# Patient Record
Sex: Female | Born: 1970 | Race: White | Hispanic: No | Marital: Married
Health system: Southern US, Community
[De-identification: ages and names within clinical notes are randomized; demographics above are authoritative.]

---

## 2016-10-24 ENCOUNTER — Ambulatory Visit: Payer: Self-pay | Admitting: Osteopathic Medicine

## 2016-11-16 DIAGNOSIS — Z1231 Encounter for screening mammogram for malignant neoplasm of breast: Secondary | ICD-10-CM | POA: Diagnosis not present

## 2016-11-20 DIAGNOSIS — S61411D Laceration without foreign body of right hand, subsequent encounter: Secondary | ICD-10-CM | POA: Diagnosis not present

## 2016-11-20 DIAGNOSIS — Z4802 Encounter for removal of sutures: Secondary | ICD-10-CM | POA: Diagnosis not present

## 2016-12-06 DIAGNOSIS — R110 Nausea: Secondary | ICD-10-CM | POA: Diagnosis not present

## 2016-12-06 DIAGNOSIS — M5136 Other intervertebral disc degeneration, lumbar region: Secondary | ICD-10-CM | POA: Diagnosis not present

## 2016-12-06 DIAGNOSIS — G8929 Other chronic pain: Secondary | ICD-10-CM | POA: Diagnosis not present

## 2016-12-06 DIAGNOSIS — M5442 Lumbago with sciatica, left side: Secondary | ICD-10-CM | POA: Diagnosis not present

## 2016-12-06 DIAGNOSIS — K824 Cholesterolosis of gallbladder: Secondary | ICD-10-CM | POA: Diagnosis not present

## 2016-12-06 DIAGNOSIS — M5441 Lumbago with sciatica, right side: Secondary | ICD-10-CM | POA: Diagnosis not present

## 2016-12-06 DIAGNOSIS — M542 Cervicalgia: Secondary | ICD-10-CM | POA: Diagnosis not present

## 2017-01-16 DIAGNOSIS — Z79899 Other long term (current) drug therapy: Secondary | ICD-10-CM | POA: Diagnosis not present

## 2017-01-16 DIAGNOSIS — R202 Paresthesia of skin: Secondary | ICD-10-CM | POA: Diagnosis not present

## 2017-01-16 DIAGNOSIS — M5417 Radiculopathy, lumbosacral region: Secondary | ICD-10-CM | POA: Diagnosis not present

## 2017-01-16 DIAGNOSIS — G43019 Migraine without aura, intractable, without status migrainosus: Secondary | ICD-10-CM | POA: Diagnosis not present

## 2017-01-23 DIAGNOSIS — L600 Ingrowing nail: Secondary | ICD-10-CM | POA: Diagnosis not present

## 2017-01-23 DIAGNOSIS — M898X7 Other specified disorders of bone, ankle and foot: Secondary | ICD-10-CM | POA: Diagnosis not present

## 2017-01-23 DIAGNOSIS — L03032 Cellulitis of left toe: Secondary | ICD-10-CM | POA: Diagnosis not present

## 2017-02-27 DIAGNOSIS — L600 Ingrowing nail: Secondary | ICD-10-CM | POA: Diagnosis not present

## 2017-03-07 DIAGNOSIS — M545 Low back pain: Secondary | ICD-10-CM | POA: Diagnosis not present

## 2017-03-07 DIAGNOSIS — R350 Frequency of micturition: Secondary | ICD-10-CM | POA: Diagnosis not present

## 2017-04-24 DIAGNOSIS — G43019 Migraine without aura, intractable, without status migrainosus: Secondary | ICD-10-CM | POA: Diagnosis not present

## 2017-04-24 DIAGNOSIS — M5417 Radiculopathy, lumbosacral region: Secondary | ICD-10-CM | POA: Diagnosis not present

## 2017-04-24 DIAGNOSIS — Z79899 Other long term (current) drug therapy: Secondary | ICD-10-CM | POA: Diagnosis not present

## 2017-04-24 DIAGNOSIS — M542 Cervicalgia: Secondary | ICD-10-CM | POA: Diagnosis not present

## 2017-04-24 DIAGNOSIS — M5412 Radiculopathy, cervical region: Secondary | ICD-10-CM | POA: Diagnosis not present

## 2017-07-31 DIAGNOSIS — G43019 Migraine without aura, intractable, without status migrainosus: Secondary | ICD-10-CM | POA: Diagnosis not present

## 2017-07-31 DIAGNOSIS — M5417 Radiculopathy, lumbosacral region: Secondary | ICD-10-CM | POA: Diagnosis not present

## 2017-07-31 DIAGNOSIS — Z79899 Other long term (current) drug therapy: Secondary | ICD-10-CM | POA: Diagnosis not present

## 2017-07-31 DIAGNOSIS — R270 Ataxia, unspecified: Secondary | ICD-10-CM | POA: Diagnosis not present

## 2017-07-31 DIAGNOSIS — G5603 Carpal tunnel syndrome, bilateral upper limbs: Secondary | ICD-10-CM | POA: Diagnosis not present

## 2017-07-31 DIAGNOSIS — M542 Cervicalgia: Secondary | ICD-10-CM | POA: Diagnosis not present

## 2017-09-11 DIAGNOSIS — G4719 Other hypersomnia: Secondary | ICD-10-CM | POA: Diagnosis not present

## 2017-09-11 DIAGNOSIS — R270 Ataxia, unspecified: Secondary | ICD-10-CM | POA: Diagnosis not present

## 2017-09-11 DIAGNOSIS — G5603 Carpal tunnel syndrome, bilateral upper limbs: Secondary | ICD-10-CM | POA: Diagnosis not present

## 2017-09-11 DIAGNOSIS — M5417 Radiculopathy, lumbosacral region: Secondary | ICD-10-CM | POA: Diagnosis not present

## 2017-09-11 DIAGNOSIS — G43019 Migraine without aura, intractable, without status migrainosus: Secondary | ICD-10-CM | POA: Diagnosis not present

## 2017-09-11 DIAGNOSIS — M5412 Radiculopathy, cervical region: Secondary | ICD-10-CM | POA: Diagnosis not present

## 2017-09-11 DIAGNOSIS — R202 Paresthesia of skin: Secondary | ICD-10-CM | POA: Diagnosis not present

## 2017-11-06 DIAGNOSIS — R260 Ataxic gait: Secondary | ICD-10-CM | POA: Diagnosis not present

## 2017-11-06 DIAGNOSIS — M5417 Radiculopathy, lumbosacral region: Secondary | ICD-10-CM | POA: Diagnosis not present

## 2017-11-06 DIAGNOSIS — G43019 Migraine without aura, intractable, without status migrainosus: Secondary | ICD-10-CM | POA: Diagnosis not present

## 2017-11-25 DIAGNOSIS — G5603 Carpal tunnel syndrome, bilateral upper limbs: Secondary | ICD-10-CM | POA: Diagnosis not present

## 2017-11-25 DIAGNOSIS — R5383 Other fatigue: Secondary | ICD-10-CM | POA: Diagnosis not present

## 2017-11-25 DIAGNOSIS — Z1231 Encounter for screening mammogram for malignant neoplasm of breast: Secondary | ICD-10-CM | POA: Diagnosis not present

## 2017-11-25 DIAGNOSIS — Z0000 Encounter for general adult medical examination without abnormal findings: Secondary | ICD-10-CM | POA: Diagnosis not present

## 2017-11-25 DIAGNOSIS — K5900 Constipation, unspecified: Secondary | ICD-10-CM | POA: Diagnosis not present

## 2017-11-25 DIAGNOSIS — R002 Palpitations: Secondary | ICD-10-CM | POA: Diagnosis not present

## 2017-11-26 DIAGNOSIS — Z0000 Encounter for general adult medical examination without abnormal findings: Secondary | ICD-10-CM | POA: Diagnosis not present

## 2017-11-26 DIAGNOSIS — R002 Palpitations: Secondary | ICD-10-CM | POA: Diagnosis not present

## 2018-02-12 DIAGNOSIS — J209 Acute bronchitis, unspecified: Secondary | ICD-10-CM | POA: Diagnosis not present

## 2018-02-12 DIAGNOSIS — N390 Urinary tract infection, site not specified: Secondary | ICD-10-CM | POA: Diagnosis not present

## 2018-03-26 DIAGNOSIS — R05 Cough: Secondary | ICD-10-CM | POA: Diagnosis not present

## 2018-03-26 DIAGNOSIS — R062 Wheezing: Secondary | ICD-10-CM | POA: Diagnosis not present

## 2018-05-28 DIAGNOSIS — Z79899 Other long term (current) drug therapy: Secondary | ICD-10-CM | POA: Diagnosis not present

## 2018-05-28 DIAGNOSIS — G43109 Migraine with aura, not intractable, without status migrainosus: Secondary | ICD-10-CM | POA: Diagnosis not present

## 2018-05-28 DIAGNOSIS — M5417 Radiculopathy, lumbosacral region: Secondary | ICD-10-CM | POA: Diagnosis not present

## 2018-07-29 DIAGNOSIS — J019 Acute sinusitis, unspecified: Secondary | ICD-10-CM | POA: Diagnosis not present

## 2018-07-29 DIAGNOSIS — Z03818 Encounter for observation for suspected exposure to other biological agents ruled out: Secondary | ICD-10-CM | POA: Diagnosis not present

## 2018-08-23 DIAGNOSIS — M545 Low back pain: Secondary | ICD-10-CM | POA: Diagnosis not present

## 2018-08-23 DIAGNOSIS — R3129 Other microscopic hematuria: Secondary | ICD-10-CM | POA: Diagnosis not present

## 2018-08-24 ENCOUNTER — Other Ambulatory Visit: Payer: Self-pay

## 2018-08-24 ENCOUNTER — Other Ambulatory Visit: Payer: Self-pay | Admitting: Unknown Physician Specialty

## 2018-08-24 ENCOUNTER — Ambulatory Visit (INDEPENDENT_AMBULATORY_CARE_PROVIDER_SITE_OTHER): Payer: BC Managed Care – PPO

## 2018-08-24 DIAGNOSIS — R0602 Shortness of breath: Secondary | ICD-10-CM | POA: Diagnosis not present

## 2018-08-24 DIAGNOSIS — R319 Hematuria, unspecified: Secondary | ICD-10-CM

## 2018-08-24 DIAGNOSIS — K5900 Constipation, unspecified: Secondary | ICD-10-CM | POA: Diagnosis not present

## 2018-08-24 DIAGNOSIS — M549 Dorsalgia, unspecified: Secondary | ICD-10-CM | POA: Diagnosis not present

## 2018-08-24 DIAGNOSIS — R109 Unspecified abdominal pain: Secondary | ICD-10-CM | POA: Diagnosis not present

## 2018-08-25 ENCOUNTER — Other Ambulatory Visit: Payer: Self-pay | Admitting: Unknown Physician Specialty

## 2018-08-25 DIAGNOSIS — R319 Hematuria, unspecified: Secondary | ICD-10-CM

## 2018-08-25 DIAGNOSIS — R109 Unspecified abdominal pain: Secondary | ICD-10-CM

## 2018-08-26 ENCOUNTER — Other Ambulatory Visit: Payer: Self-pay

## 2018-08-26 ENCOUNTER — Ambulatory Visit (INDEPENDENT_AMBULATORY_CARE_PROVIDER_SITE_OTHER): Payer: BC Managed Care – PPO

## 2018-08-26 DIAGNOSIS — R109 Unspecified abdominal pain: Secondary | ICD-10-CM | POA: Diagnosis not present

## 2018-08-26 DIAGNOSIS — R319 Hematuria, unspecified: Secondary | ICD-10-CM

## 2018-09-09 DIAGNOSIS — M5412 Radiculopathy, cervical region: Secondary | ICD-10-CM | POA: Diagnosis not present

## 2018-09-09 DIAGNOSIS — E538 Deficiency of other specified B group vitamins: Secondary | ICD-10-CM | POA: Diagnosis not present

## 2018-09-09 DIAGNOSIS — R202 Paresthesia of skin: Secondary | ICD-10-CM | POA: Diagnosis not present

## 2018-09-09 DIAGNOSIS — G43019 Migraine without aura, intractable, without status migrainosus: Secondary | ICD-10-CM | POA: Diagnosis not present

## 2018-09-09 DIAGNOSIS — R634 Abnormal weight loss: Secondary | ICD-10-CM | POA: Diagnosis not present

## 2018-09-09 DIAGNOSIS — G5603 Carpal tunnel syndrome, bilateral upper limbs: Secondary | ICD-10-CM | POA: Diagnosis not present

## 2018-09-09 DIAGNOSIS — G478 Other sleep disorders: Secondary | ICD-10-CM | POA: Diagnosis not present

## 2018-09-09 DIAGNOSIS — M5417 Radiculopathy, lumbosacral region: Secondary | ICD-10-CM | POA: Diagnosis not present

## 2018-09-09 DIAGNOSIS — G609 Hereditary and idiopathic neuropathy, unspecified: Secondary | ICD-10-CM | POA: Diagnosis not present

## 2018-09-09 DIAGNOSIS — Z79899 Other long term (current) drug therapy: Secondary | ICD-10-CM | POA: Diagnosis not present

## 2018-10-07 DIAGNOSIS — G478 Other sleep disorders: Secondary | ICD-10-CM | POA: Diagnosis not present

## 2018-10-07 DIAGNOSIS — Z79899 Other long term (current) drug therapy: Secondary | ICD-10-CM | POA: Diagnosis not present

## 2018-10-07 DIAGNOSIS — G43019 Migraine without aura, intractable, without status migrainosus: Secondary | ICD-10-CM | POA: Diagnosis not present

## 2018-10-07 DIAGNOSIS — M5417 Radiculopathy, lumbosacral region: Secondary | ICD-10-CM | POA: Diagnosis not present

## 2018-11-04 DIAGNOSIS — Z0000 Encounter for general adult medical examination without abnormal findings: Secondary | ICD-10-CM | POA: Diagnosis not present

## 2019-01-19 DIAGNOSIS — G43019 Migraine without aura, intractable, without status migrainosus: Secondary | ICD-10-CM | POA: Diagnosis not present

## 2019-01-19 DIAGNOSIS — M5417 Radiculopathy, lumbosacral region: Secondary | ICD-10-CM | POA: Diagnosis not present

## 2019-01-19 DIAGNOSIS — Z79899 Other long term (current) drug therapy: Secondary | ICD-10-CM | POA: Diagnosis not present

## 2019-01-19 DIAGNOSIS — M5412 Radiculopathy, cervical region: Secondary | ICD-10-CM | POA: Diagnosis not present

## 2019-03-25 DIAGNOSIS — M549 Dorsalgia, unspecified: Secondary | ICD-10-CM | POA: Diagnosis not present

## 2019-05-12 DIAGNOSIS — M542 Cervicalgia: Secondary | ICD-10-CM | POA: Diagnosis not present

## 2019-05-12 DIAGNOSIS — G4719 Other hypersomnia: Secondary | ICD-10-CM | POA: Diagnosis not present

## 2019-05-12 DIAGNOSIS — Z79899 Other long term (current) drug therapy: Secondary | ICD-10-CM | POA: Diagnosis not present

## 2019-05-12 DIAGNOSIS — M5417 Radiculopathy, lumbosacral region: Secondary | ICD-10-CM | POA: Diagnosis not present

## 2019-05-12 DIAGNOSIS — E559 Vitamin D deficiency, unspecified: Secondary | ICD-10-CM | POA: Diagnosis not present

## 2019-05-12 DIAGNOSIS — R5383 Other fatigue: Secondary | ICD-10-CM | POA: Diagnosis not present

## 2019-05-12 DIAGNOSIS — G43019 Migraine without aura, intractable, without status migrainosus: Secondary | ICD-10-CM | POA: Diagnosis not present

## 2019-05-12 DIAGNOSIS — R634 Abnormal weight loss: Secondary | ICD-10-CM | POA: Diagnosis not present

## 2019-05-12 DIAGNOSIS — E538 Deficiency of other specified B group vitamins: Secondary | ICD-10-CM | POA: Diagnosis not present

## 2019-06-02 DIAGNOSIS — G5603 Carpal tunnel syndrome, bilateral upper limbs: Secondary | ICD-10-CM | POA: Diagnosis not present

## 2019-06-02 DIAGNOSIS — M5412 Radiculopathy, cervical region: Secondary | ICD-10-CM | POA: Diagnosis not present

## 2019-06-02 DIAGNOSIS — M545 Low back pain: Secondary | ICD-10-CM | POA: Diagnosis not present

## 2019-06-02 DIAGNOSIS — M542 Cervicalgia: Secondary | ICD-10-CM | POA: Diagnosis not present

## 2019-06-02 DIAGNOSIS — R202 Paresthesia of skin: Secondary | ICD-10-CM | POA: Diagnosis not present

## 2019-06-02 DIAGNOSIS — M5417 Radiculopathy, lumbosacral region: Secondary | ICD-10-CM | POA: Diagnosis not present

## 2019-06-02 DIAGNOSIS — G43019 Migraine without aura, intractable, without status migrainosus: Secondary | ICD-10-CM | POA: Diagnosis not present

## 2019-06-17 DIAGNOSIS — R0781 Pleurodynia: Secondary | ICD-10-CM | POA: Diagnosis not present

## 2019-06-17 DIAGNOSIS — R350 Frequency of micturition: Secondary | ICD-10-CM | POA: Diagnosis not present

## 2019-06-17 DIAGNOSIS — R7989 Other specified abnormal findings of blood chemistry: Secondary | ICD-10-CM | POA: Diagnosis not present

## 2019-06-17 DIAGNOSIS — R198 Other specified symptoms and signs involving the digestive system and abdomen: Secondary | ICD-10-CM | POA: Diagnosis not present

## 2019-06-25 DIAGNOSIS — R1030 Lower abdominal pain, unspecified: Secondary | ICD-10-CM | POA: Diagnosis not present

## 2019-06-25 DIAGNOSIS — K921 Melena: Secondary | ICD-10-CM | POA: Diagnosis not present

## 2019-06-25 DIAGNOSIS — Z1231 Encounter for screening mammogram for malignant neoplasm of breast: Secondary | ICD-10-CM | POA: Diagnosis not present

## 2019-06-25 DIAGNOSIS — R110 Nausea: Secondary | ICD-10-CM | POA: Diagnosis not present

## 2019-06-25 DIAGNOSIS — R195 Other fecal abnormalities: Secondary | ICD-10-CM | POA: Diagnosis not present

## 2019-06-28 DIAGNOSIS — R1030 Lower abdominal pain, unspecified: Secondary | ICD-10-CM | POA: Diagnosis not present

## 2019-06-28 DIAGNOSIS — K625 Hemorrhage of anus and rectum: Secondary | ICD-10-CM | POA: Diagnosis not present

## 2019-06-28 DIAGNOSIS — K648 Other hemorrhoids: Secondary | ICD-10-CM | POA: Diagnosis not present

## 2019-06-28 DIAGNOSIS — R197 Diarrhea, unspecified: Secondary | ICD-10-CM | POA: Diagnosis not present

## 2019-06-28 DIAGNOSIS — K3189 Other diseases of stomach and duodenum: Secondary | ICD-10-CM | POA: Diagnosis not present

## 2019-06-28 DIAGNOSIS — R110 Nausea: Secondary | ICD-10-CM | POA: Diagnosis not present

## 2019-06-28 DIAGNOSIS — K635 Polyp of colon: Secondary | ICD-10-CM | POA: Diagnosis not present

## 2019-06-28 DIAGNOSIS — K2970 Gastritis, unspecified, without bleeding: Secondary | ICD-10-CM | POA: Diagnosis not present

## 2019-08-11 DIAGNOSIS — K219 Gastro-esophageal reflux disease without esophagitis: Secondary | ICD-10-CM | POA: Diagnosis not present

## 2019-08-11 DIAGNOSIS — K582 Mixed irritable bowel syndrome: Secondary | ICD-10-CM | POA: Diagnosis not present

## 2019-08-20 IMAGING — DX ABDOMEN - 2 VIEW
4 series · 4 of 4 positions shown · non-contrast
Comparison: No recent prior.

CLINICAL DATA: Right flank pain.  Blood in urine.

EXAM:
ABDOMEN - 2 VIEW

[abdomen erect (1 of 2)]
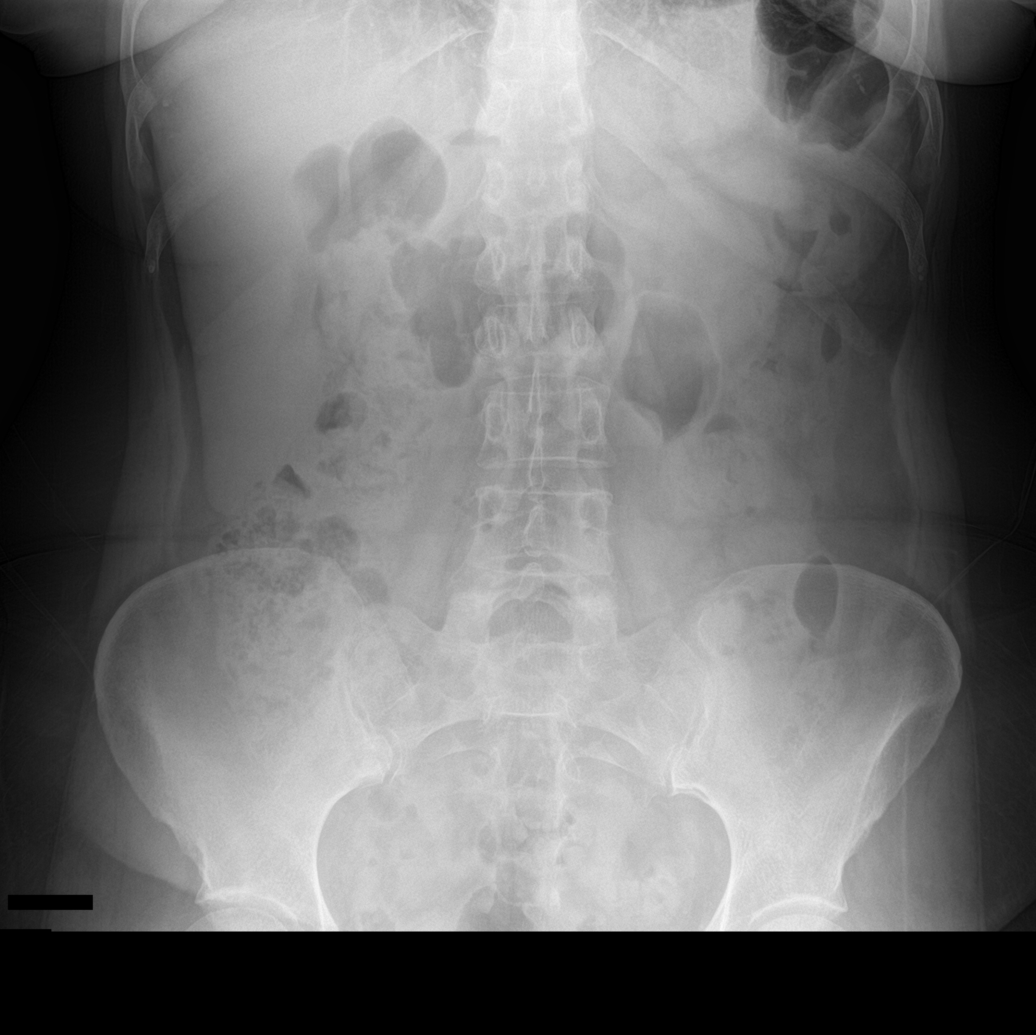

[abdomen supine (1 of 2)]
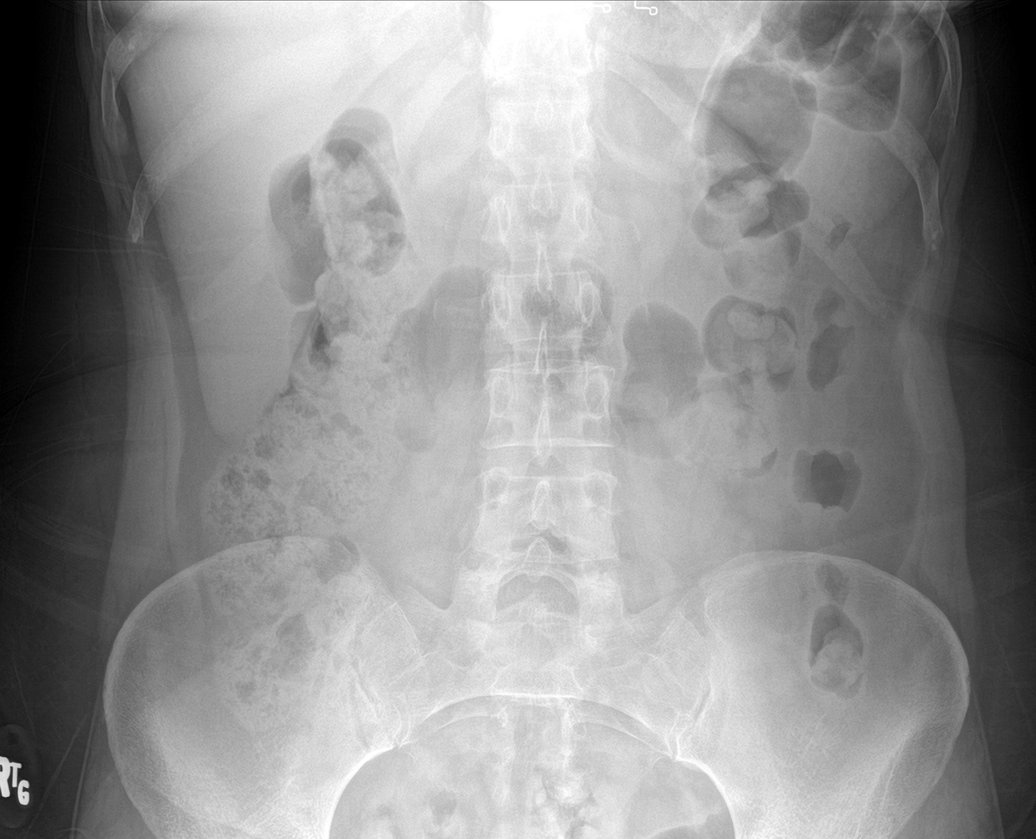

[abdomen supine (2 of 2)]
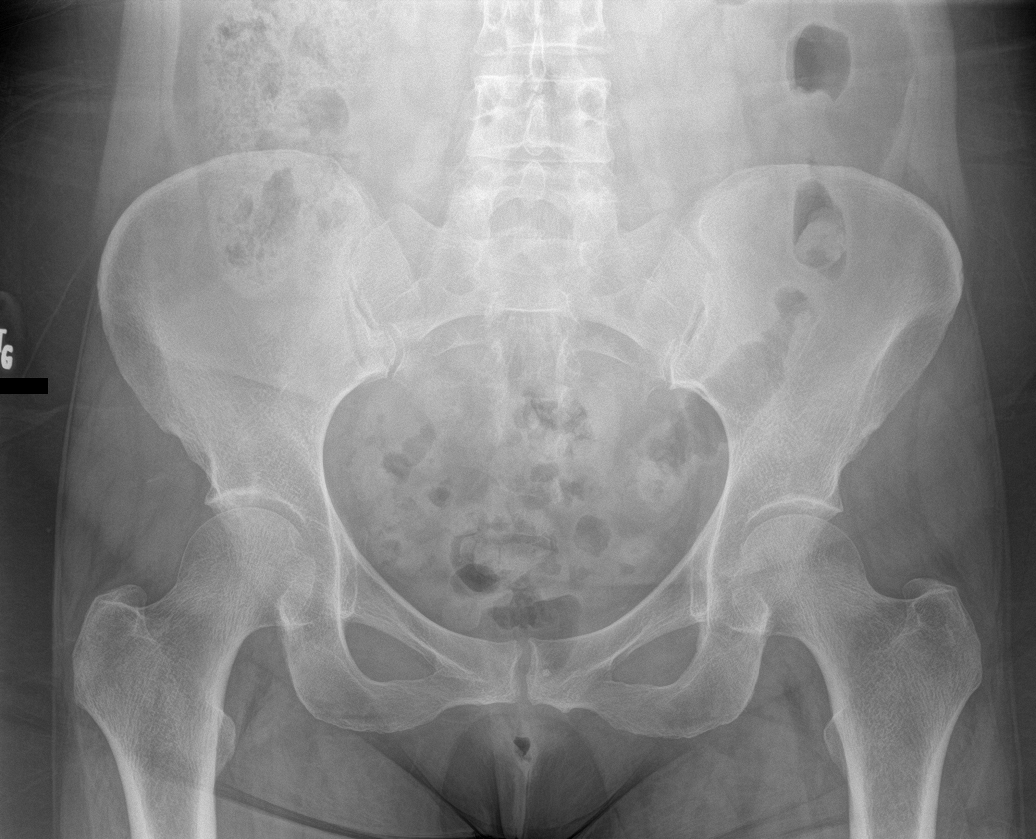

[abdomen erect (2 of 2)]
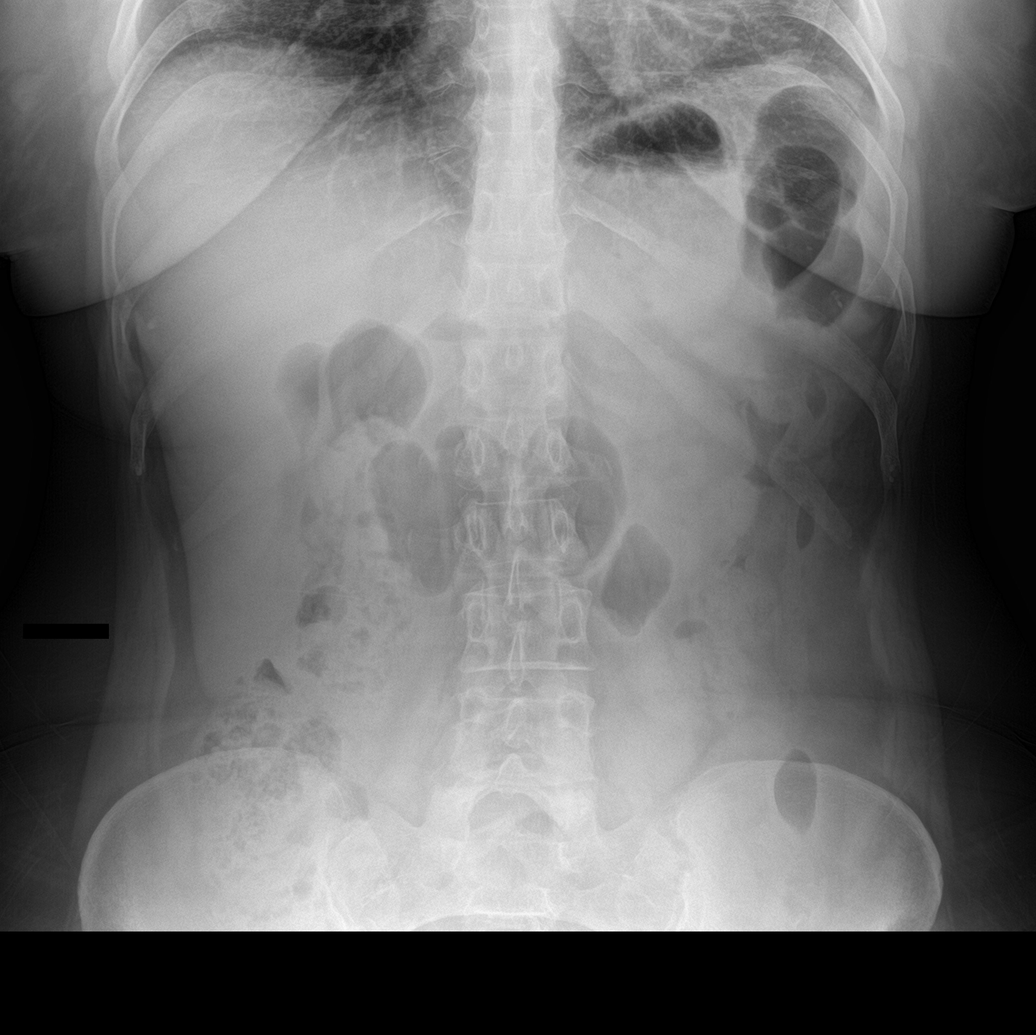

[4 of 4 positions shown; findings below may reference images not displayed]

FINDINGS: Soft tissue structures are unremarkable. Stool noted throughout the
colon. No bowel distention or free air. Stool makes evaluation for
renal/ureteral stone disease difficult. No renal or ureteral stones
noted. Tiny pelvic calcification most likely a phlebolith. No acute
bony abnormality.
IMPRESSION: Stool noted throughout the colon. Stool makes evaluation for stone
disease difficult. No renal or ureteral stone noted.

## 2019-08-22 IMAGING — CT CT RENAL STONE PROTOCOL
2 of 4 series · 16 of 46 positions shown, 18 images · non-contrast
Comparison: None.

CLINICAL DATA: Right flank and abdominal pain with hematuria. Prior
hysterectomy and appendectomy.

EXAM:
CT ABDOMEN AND PELVIS WITHOUT CONTRAST
TECHNIQUE: Multidetector CT imaging of the abdomen and pelvis was performed
following the standard protocol without IV contrast.

[Series 2: axial st · axial · 0.77mm/px · z∈[-556,-76]mm · 13 of 106 slices shown, 15 images]
[im 5/106  soft-tissue]
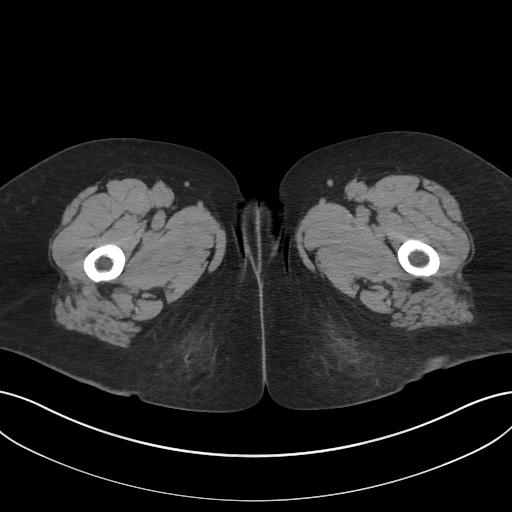
[im 5/106  bone]
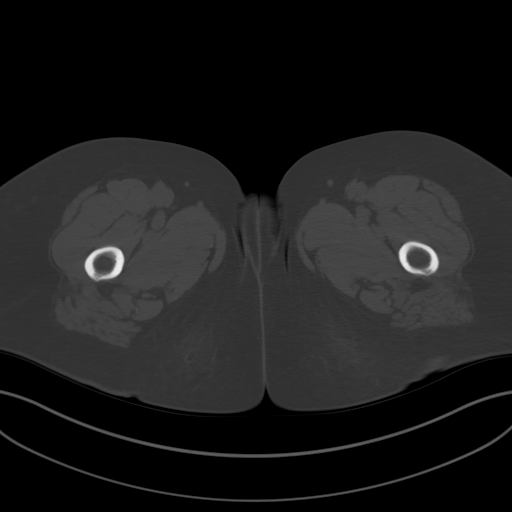
[im 13/106  soft-tissue]
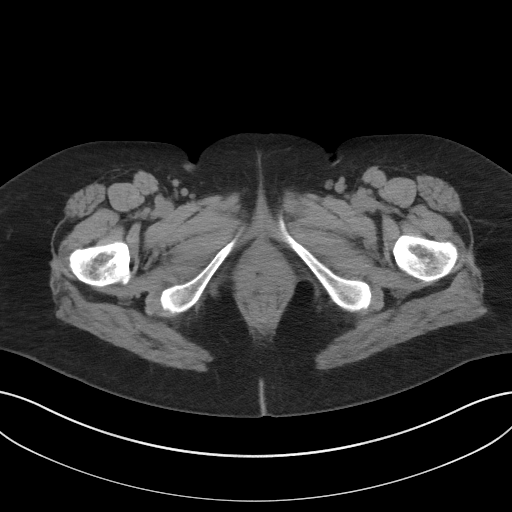
[im 22/106  soft-tissue]
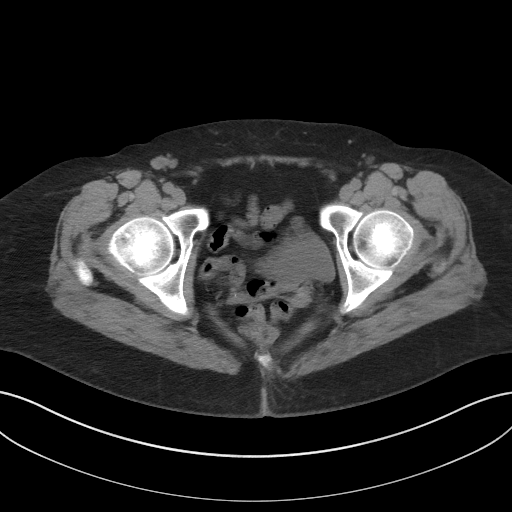
[im 30/106  soft-tissue]
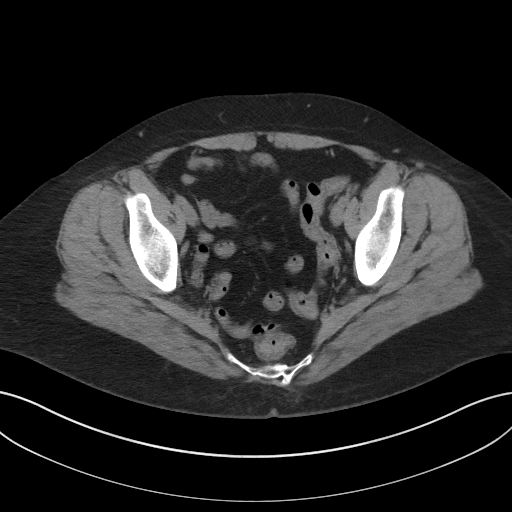
[im 38/106  soft-tissue]
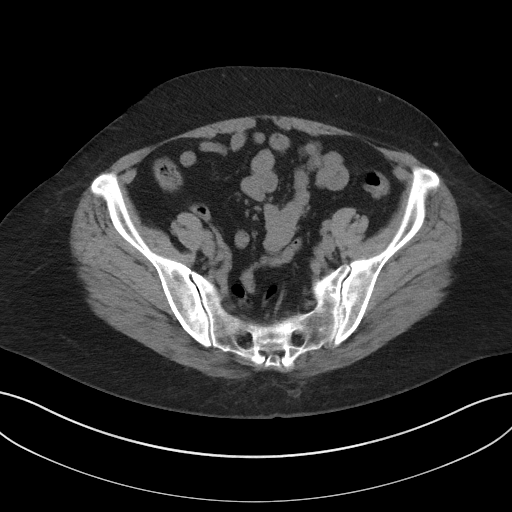
[im 47/106  soft-tissue]
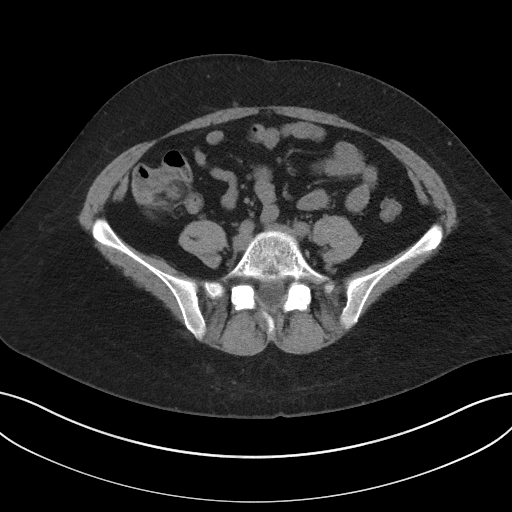
[im 55/106  soft-tissue]
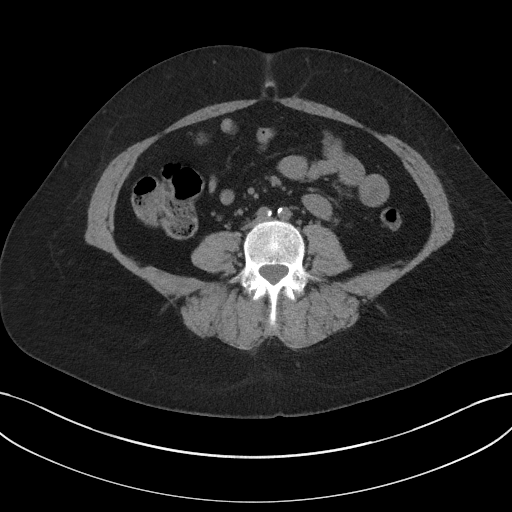
[im 59/106  soft-tissue]
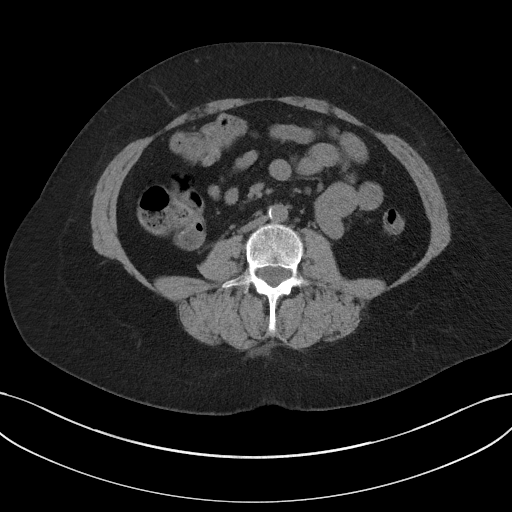
[im 68/106  soft-tissue]
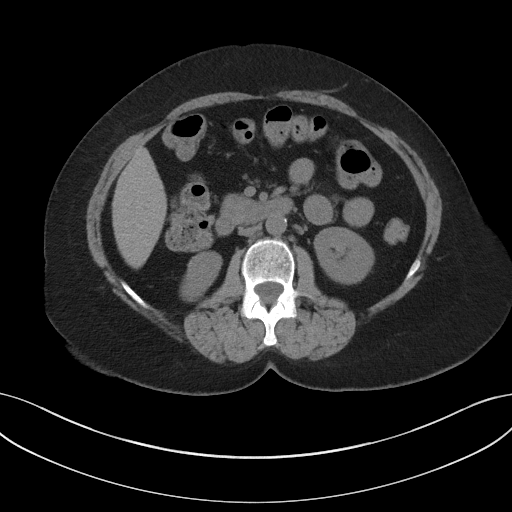
[im 68/106  bone]
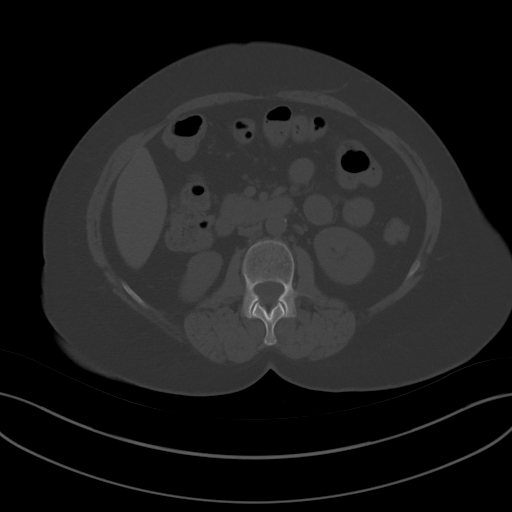
[im 76/106  soft-tissue]
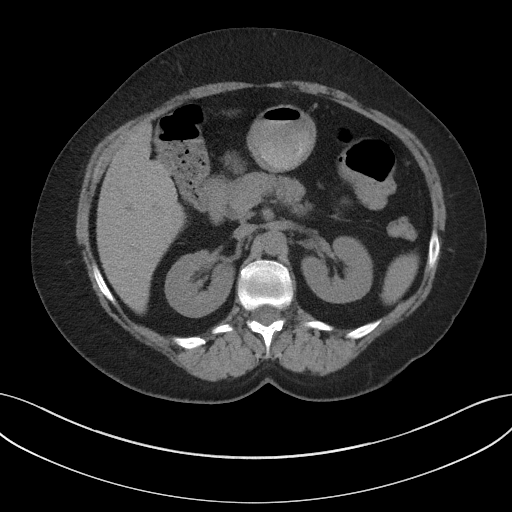
[im 85/106  soft-tissue]
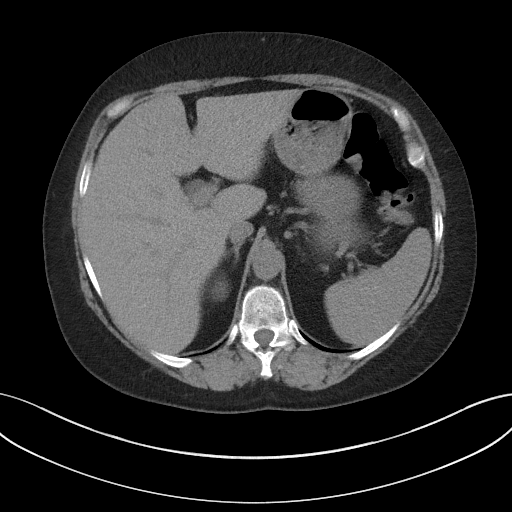
[im 93/106  soft-tissue]
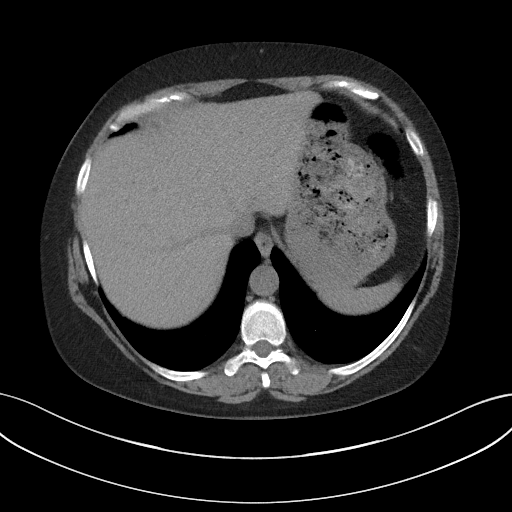
[im 101/106  soft-tissue]
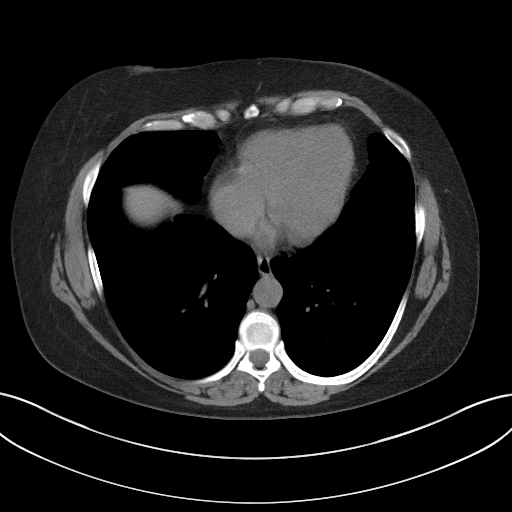

[Series 4: coronal st · coronal · 1.02mm/px · 3 of 107 slices shown]
[im 36/107  soft-tissue]
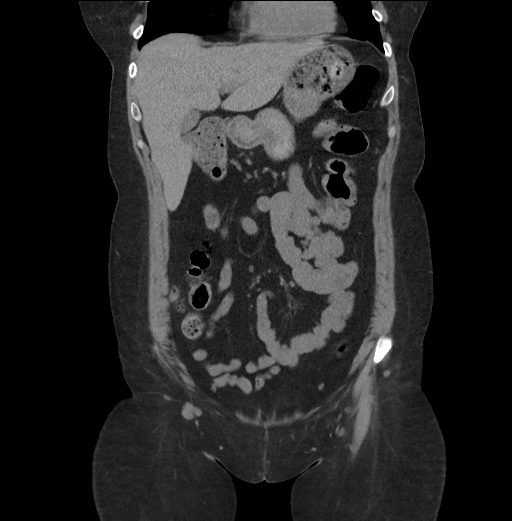
[im 48/107  soft-tissue]
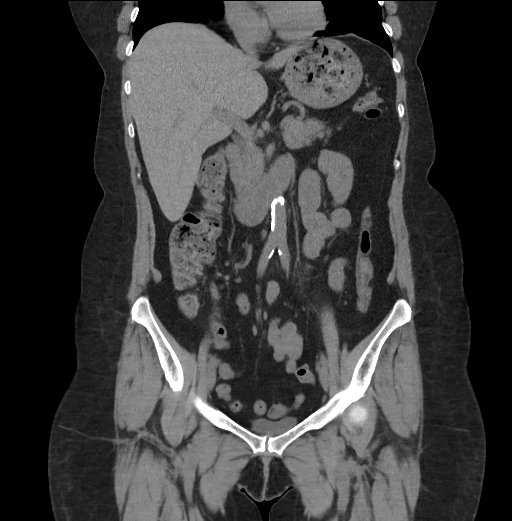
[im 59/107  soft-tissue]
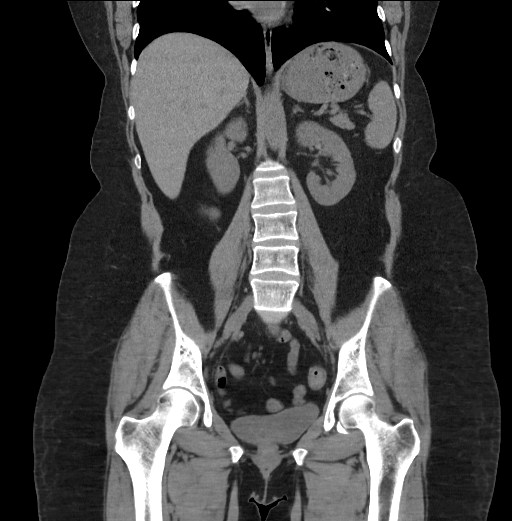

[16 of 46 positions shown; findings below may reference images not displayed]

FINDINGS: Lower chest: Unremarkable.

Hepatobiliary: No focal abnormality in the liver on this study
without intravenous contrast. There is no evidence for gallstones,
gallbladder wall thickening, or pericholecystic fluid. No
intrahepatic or extrahepatic biliary dilation.

Pancreas: No focal mass lesion. No dilatation of the main duct. No
intraparenchymal cyst. No peripancreatic edema.

Spleen: No splenomegaly. No focal mass lesion.

Adrenals/Urinary Tract: No adrenal nodule or mass. No stones are
seen in either kidney or ureter. No bladder stones. No secondary
changes in either kidney or ureter.

Stomach/Bowel: Stomach is unremarkable. No gastric wall thickening.
No evidence of outlet obstruction. Duodenum is normally positioned
as is the ligament of Treitz. No small bowel wall thickening. No
small bowel dilatation. The terminal ileum is normal.
Nonvisualization of the appendix is consistent with the reported
history of appendectomy. No gross colonic mass. No colonic wall
thickening.

Vascular/Lymphatic: There is abdominal aortic atherosclerosis
without aneurysm. There is no gastrohepatic or hepatoduodenal
ligament lymphadenopathy. No intraperitoneal or retroperitoneal
lymphadenopathy. No pelvic sidewall lymphadenopathy.

Reproductive: The uterus is surgically absent. There is no adnexal
mass.

Other: No intraperitoneal free fluid.

Musculoskeletal: No worrisome lytic or sclerotic osseous
abnormality.
IMPRESSION: 1. No findings to explain the patient's history of right flank pain
and hematuria. No urinary stone disease. No secondary changes in the
right kidney or ureter.
2. No right adnexal mass.

## 2019-09-08 DIAGNOSIS — G43019 Migraine without aura, intractable, without status migrainosus: Secondary | ICD-10-CM | POA: Diagnosis not present

## 2019-09-08 DIAGNOSIS — G5603 Carpal tunnel syndrome, bilateral upper limbs: Secondary | ICD-10-CM | POA: Diagnosis not present

## 2019-09-08 DIAGNOSIS — G4719 Other hypersomnia: Secondary | ICD-10-CM | POA: Diagnosis not present

## 2019-09-08 DIAGNOSIS — R201 Hypoesthesia of skin: Secondary | ICD-10-CM | POA: Diagnosis not present

## 2019-09-08 DIAGNOSIS — M5417 Radiculopathy, lumbosacral region: Secondary | ICD-10-CM | POA: Diagnosis not present

## 2019-10-03 DIAGNOSIS — R300 Dysuria: Secondary | ICD-10-CM | POA: Diagnosis not present

## 2019-10-14 DIAGNOSIS — Z20822 Contact with and (suspected) exposure to covid-19: Secondary | ICD-10-CM | POA: Diagnosis not present

## 2019-10-14 DIAGNOSIS — R05 Cough: Secondary | ICD-10-CM | POA: Diagnosis not present

## 2019-11-22 DIAGNOSIS — Z124 Encounter for screening for malignant neoplasm of cervix: Secondary | ICD-10-CM | POA: Diagnosis not present

## 2019-11-22 DIAGNOSIS — Z01419 Encounter for gynecological examination (general) (routine) without abnormal findings: Secondary | ICD-10-CM | POA: Diagnosis not present

## 2019-11-22 DIAGNOSIS — Z0000 Encounter for general adult medical examination without abnormal findings: Secondary | ICD-10-CM | POA: Diagnosis not present

## 2019-11-22 DIAGNOSIS — E7800 Pure hypercholesterolemia, unspecified: Secondary | ICD-10-CM | POA: Diagnosis not present

## 2019-11-22 DIAGNOSIS — Z1272 Encounter for screening for malignant neoplasm of vagina: Secondary | ICD-10-CM | POA: Diagnosis not present

## 2019-11-22 DIAGNOSIS — N393 Stress incontinence (female) (male): Secondary | ICD-10-CM | POA: Diagnosis not present

## 2019-11-22 DIAGNOSIS — Z720 Tobacco use: Secondary | ICD-10-CM | POA: Diagnosis not present

## 2019-11-22 DIAGNOSIS — Z1289 Encounter for screening for malignant neoplasm of other sites: Secondary | ICD-10-CM | POA: Diagnosis not present

## 2019-12-08 DIAGNOSIS — G43019 Migraine without aura, intractable, without status migrainosus: Secondary | ICD-10-CM | POA: Diagnosis not present

## 2019-12-08 DIAGNOSIS — G5603 Carpal tunnel syndrome, bilateral upper limbs: Secondary | ICD-10-CM | POA: Diagnosis not present

## 2019-12-08 DIAGNOSIS — R201 Hypoesthesia of skin: Secondary | ICD-10-CM | POA: Diagnosis not present

## 2019-12-08 DIAGNOSIS — M5417 Radiculopathy, lumbosacral region: Secondary | ICD-10-CM | POA: Diagnosis not present

## 2019-12-08 DIAGNOSIS — M542 Cervicalgia: Secondary | ICD-10-CM | POA: Diagnosis not present

## 2019-12-16 DIAGNOSIS — Z03818 Encounter for observation for suspected exposure to other biological agents ruled out: Secondary | ICD-10-CM | POA: Diagnosis not present

## 2019-12-16 DIAGNOSIS — J189 Pneumonia, unspecified organism: Secondary | ICD-10-CM | POA: Diagnosis not present

## 2020-02-09 DIAGNOSIS — R202 Paresthesia of skin: Secondary | ICD-10-CM | POA: Diagnosis not present

## 2020-02-09 DIAGNOSIS — M5417 Radiculopathy, lumbosacral region: Secondary | ICD-10-CM | POA: Diagnosis not present

## 2020-02-09 DIAGNOSIS — Z79899 Other long term (current) drug therapy: Secondary | ICD-10-CM | POA: Diagnosis not present

## 2020-02-09 DIAGNOSIS — G5603 Carpal tunnel syndrome, bilateral upper limbs: Secondary | ICD-10-CM | POA: Diagnosis not present

## 2020-02-09 DIAGNOSIS — G43019 Migraine without aura, intractable, without status migrainosus: Secondary | ICD-10-CM | POA: Diagnosis not present

## 2020-02-14 DIAGNOSIS — J1282 Pneumonia due to coronavirus disease 2019: Secondary | ICD-10-CM | POA: Diagnosis not present

## 2020-02-25 DIAGNOSIS — F17210 Nicotine dependence, cigarettes, uncomplicated: Secondary | ICD-10-CM | POA: Diagnosis not present

## 2020-02-25 DIAGNOSIS — E785 Hyperlipidemia, unspecified: Secondary | ICD-10-CM | POA: Diagnosis not present

## 2020-02-25 DIAGNOSIS — R0602 Shortness of breath: Secondary | ICD-10-CM | POA: Diagnosis not present

## 2020-02-25 DIAGNOSIS — Z885 Allergy status to narcotic agent status: Secondary | ICD-10-CM | POA: Diagnosis not present

## 2020-02-25 DIAGNOSIS — J9811 Atelectasis: Secondary | ICD-10-CM | POA: Diagnosis not present

## 2020-02-25 DIAGNOSIS — R0789 Other chest pain: Secondary | ICD-10-CM | POA: Diagnosis not present

## 2020-02-25 DIAGNOSIS — R059 Cough, unspecified: Secondary | ICD-10-CM | POA: Diagnosis not present

## 2020-02-25 DIAGNOSIS — G709 Myoneural disorder, unspecified: Secondary | ICD-10-CM | POA: Diagnosis not present

## 2020-02-25 DIAGNOSIS — R079 Chest pain, unspecified: Secondary | ICD-10-CM | POA: Diagnosis not present

## 2020-02-25 DIAGNOSIS — Z79899 Other long term (current) drug therapy: Secondary | ICD-10-CM | POA: Diagnosis not present

## 2020-02-25 DIAGNOSIS — U071 COVID-19: Secondary | ICD-10-CM | POA: Diagnosis not present

## 2020-04-12 DIAGNOSIS — M5417 Radiculopathy, lumbosacral region: Secondary | ICD-10-CM | POA: Diagnosis not present

## 2020-04-12 DIAGNOSIS — G43019 Migraine without aura, intractable, without status migrainosus: Secondary | ICD-10-CM | POA: Diagnosis not present

## 2020-04-12 DIAGNOSIS — Z79899 Other long term (current) drug therapy: Secondary | ICD-10-CM | POA: Diagnosis not present

## 2020-04-12 DIAGNOSIS — R201 Hypoesthesia of skin: Secondary | ICD-10-CM | POA: Diagnosis not present

## 2020-06-07 DIAGNOSIS — G5603 Carpal tunnel syndrome, bilateral upper limbs: Secondary | ICD-10-CM | POA: Diagnosis not present

## 2020-06-07 DIAGNOSIS — G43019 Migraine without aura, intractable, without status migrainosus: Secondary | ICD-10-CM | POA: Diagnosis not present

## 2020-06-07 DIAGNOSIS — R609 Edema, unspecified: Secondary | ICD-10-CM | POA: Diagnosis not present

## 2020-06-07 DIAGNOSIS — R0602 Shortness of breath: Secondary | ICD-10-CM | POA: Diagnosis not present

## 2020-06-07 DIAGNOSIS — M5412 Radiculopathy, cervical region: Secondary | ICD-10-CM | POA: Diagnosis not present

## 2020-06-07 DIAGNOSIS — R079 Chest pain, unspecified: Secondary | ICD-10-CM | POA: Diagnosis not present

## 2020-06-07 DIAGNOSIS — M5417 Radiculopathy, lumbosacral region: Secondary | ICD-10-CM | POA: Diagnosis not present

## 2020-06-21 DIAGNOSIS — R079 Chest pain, unspecified: Secondary | ICD-10-CM | POA: Diagnosis not present

## 2020-06-21 DIAGNOSIS — Z03818 Encounter for observation for suspected exposure to other biological agents ruled out: Secondary | ICD-10-CM | POA: Diagnosis not present

## 2020-06-21 DIAGNOSIS — Z136 Encounter for screening for cardiovascular disorders: Secondary | ICD-10-CM | POA: Diagnosis not present

## 2020-06-21 DIAGNOSIS — J209 Acute bronchitis, unspecified: Secondary | ICD-10-CM | POA: Diagnosis not present

## 2020-07-03 DIAGNOSIS — I517 Cardiomegaly: Secondary | ICD-10-CM | POA: Diagnosis not present

## 2020-07-03 DIAGNOSIS — I358 Other nonrheumatic aortic valve disorders: Secondary | ICD-10-CM | POA: Diagnosis not present

## 2020-07-06 DIAGNOSIS — R600 Localized edema: Secondary | ICD-10-CM | POA: Diagnosis not present

## 2020-07-06 DIAGNOSIS — R0602 Shortness of breath: Secondary | ICD-10-CM | POA: Diagnosis not present

## 2020-07-06 DIAGNOSIS — R072 Precordial pain: Secondary | ICD-10-CM | POA: Diagnosis not present

## 2020-07-06 DIAGNOSIS — R030 Elevated blood-pressure reading, without diagnosis of hypertension: Secondary | ICD-10-CM | POA: Diagnosis not present

## 2020-07-12 DIAGNOSIS — G4726 Circadian rhythm sleep disorder, shift work type: Secondary | ICD-10-CM | POA: Diagnosis not present

## 2020-07-12 DIAGNOSIS — R0602 Shortness of breath: Secondary | ICD-10-CM | POA: Diagnosis not present

## 2020-07-12 DIAGNOSIS — G43019 Migraine without aura, intractable, without status migrainosus: Secondary | ICD-10-CM | POA: Diagnosis not present

## 2020-07-12 DIAGNOSIS — R9401 Abnormal electroencephalogram [EEG]: Secondary | ICD-10-CM | POA: Diagnosis not present

## 2020-07-12 DIAGNOSIS — R5383 Other fatigue: Secondary | ICD-10-CM | POA: Diagnosis not present

## 2020-07-24 DIAGNOSIS — R233 Spontaneous ecchymoses: Secondary | ICD-10-CM | POA: Diagnosis not present

## 2020-07-25 DIAGNOSIS — M5417 Radiculopathy, lumbosacral region: Secondary | ICD-10-CM | POA: Diagnosis not present

## 2020-07-25 DIAGNOSIS — M542 Cervicalgia: Secondary | ICD-10-CM | POA: Diagnosis not present

## 2020-07-25 DIAGNOSIS — R600 Localized edema: Secondary | ICD-10-CM | POA: Diagnosis not present

## 2020-07-25 DIAGNOSIS — Z79899 Other long term (current) drug therapy: Secondary | ICD-10-CM | POA: Diagnosis not present

## 2020-07-25 DIAGNOSIS — R072 Precordial pain: Secondary | ICD-10-CM | POA: Diagnosis not present

## 2020-07-25 DIAGNOSIS — R0602 Shortness of breath: Secondary | ICD-10-CM | POA: Diagnosis not present

## 2020-07-25 DIAGNOSIS — R030 Elevated blood-pressure reading, without diagnosis of hypertension: Secondary | ICD-10-CM | POA: Diagnosis not present

## 2020-07-25 DIAGNOSIS — G43019 Migraine without aura, intractable, without status migrainosus: Secondary | ICD-10-CM | POA: Diagnosis not present

## 2020-08-02 DIAGNOSIS — R0789 Other chest pain: Secondary | ICD-10-CM | POA: Diagnosis not present

## 2020-10-17 DIAGNOSIS — I10 Essential (primary) hypertension: Secondary | ICD-10-CM | POA: Diagnosis not present

## 2020-10-26 DIAGNOSIS — D72829 Elevated white blood cell count, unspecified: Secondary | ICD-10-CM | POA: Diagnosis not present

## 2020-10-26 DIAGNOSIS — Z0000 Encounter for general adult medical examination without abnormal findings: Secondary | ICD-10-CM | POA: Diagnosis not present

## 2020-10-26 DIAGNOSIS — R030 Elevated blood-pressure reading, without diagnosis of hypertension: Secondary | ICD-10-CM | POA: Diagnosis not present

## 2020-10-26 DIAGNOSIS — N3941 Urge incontinence: Secondary | ICD-10-CM | POA: Diagnosis not present

## 2020-11-01 DIAGNOSIS — M797 Fibromyalgia: Secondary | ICD-10-CM | POA: Diagnosis not present

## 2020-11-01 DIAGNOSIS — R202 Paresthesia of skin: Secondary | ICD-10-CM | POA: Diagnosis not present

## 2020-11-01 DIAGNOSIS — R110 Nausea: Secondary | ICD-10-CM | POA: Diagnosis not present

## 2020-11-01 DIAGNOSIS — M5417 Radiculopathy, lumbosacral region: Secondary | ICD-10-CM | POA: Diagnosis not present

## 2020-11-01 DIAGNOSIS — G43019 Migraine without aura, intractable, without status migrainosus: Secondary | ICD-10-CM | POA: Diagnosis not present

## 2020-11-01 DIAGNOSIS — M542 Cervicalgia: Secondary | ICD-10-CM | POA: Diagnosis not present

## 2020-12-05 DIAGNOSIS — M5417 Radiculopathy, lumbosacral region: Secondary | ICD-10-CM | POA: Diagnosis not present

## 2020-12-05 DIAGNOSIS — G43019 Migraine without aura, intractable, without status migrainosus: Secondary | ICD-10-CM | POA: Diagnosis not present

## 2020-12-05 DIAGNOSIS — G8929 Other chronic pain: Secondary | ICD-10-CM | POA: Diagnosis not present

## 2020-12-05 DIAGNOSIS — M797 Fibromyalgia: Secondary | ICD-10-CM | POA: Diagnosis not present

## 2020-12-05 DIAGNOSIS — M5412 Radiculopathy, cervical region: Secondary | ICD-10-CM | POA: Diagnosis not present

## 2021-01-16 DIAGNOSIS — G43109 Migraine with aura, not intractable, without status migrainosus: Secondary | ICD-10-CM | POA: Diagnosis not present

## 2021-01-16 DIAGNOSIS — M5417 Radiculopathy, lumbosacral region: Secondary | ICD-10-CM | POA: Diagnosis not present

## 2021-01-16 DIAGNOSIS — M542 Cervicalgia: Secondary | ICD-10-CM | POA: Diagnosis not present

## 2021-01-16 DIAGNOSIS — Z79899 Other long term (current) drug therapy: Secondary | ICD-10-CM | POA: Diagnosis not present

## 2021-01-16 DIAGNOSIS — M797 Fibromyalgia: Secondary | ICD-10-CM | POA: Diagnosis not present

## 2021-03-20 DIAGNOSIS — M542 Cervicalgia: Secondary | ICD-10-CM | POA: Diagnosis not present

## 2021-03-20 DIAGNOSIS — Z79899 Other long term (current) drug therapy: Secondary | ICD-10-CM | POA: Diagnosis not present

## 2021-03-20 DIAGNOSIS — M5417 Radiculopathy, lumbosacral region: Secondary | ICD-10-CM | POA: Diagnosis not present

## 2021-03-20 DIAGNOSIS — G43019 Migraine without aura, intractable, without status migrainosus: Secondary | ICD-10-CM | POA: Diagnosis not present

## 2021-03-20 DIAGNOSIS — M797 Fibromyalgia: Secondary | ICD-10-CM | POA: Diagnosis not present

## 2021-05-03 DIAGNOSIS — D72829 Elevated white blood cell count, unspecified: Secondary | ICD-10-CM | POA: Diagnosis not present

## 2021-05-03 DIAGNOSIS — R030 Elevated blood-pressure reading, without diagnosis of hypertension: Secondary | ICD-10-CM | POA: Diagnosis not present

## 2021-05-10 DIAGNOSIS — J40 Bronchitis, not specified as acute or chronic: Secondary | ICD-10-CM | POA: Diagnosis not present

## 2021-05-10 DIAGNOSIS — R030 Elevated blood-pressure reading, without diagnosis of hypertension: Secondary | ICD-10-CM | POA: Diagnosis not present

## 2021-05-10 DIAGNOSIS — D72829 Elevated white blood cell count, unspecified: Secondary | ICD-10-CM | POA: Diagnosis not present

## 2021-05-10 DIAGNOSIS — R071 Chest pain on breathing: Secondary | ICD-10-CM | POA: Diagnosis not present

## 2021-06-06 ENCOUNTER — Other Ambulatory Visit: Payer: Self-pay | Admitting: *Deleted

## 2021-06-06 DIAGNOSIS — D72829 Elevated white blood cell count, unspecified: Secondary | ICD-10-CM

## 2021-06-06 NOTE — Progress Notes (Signed)
Lab orders entered for New pt appt 

## 2021-06-07 DIAGNOSIS — D72829 Elevated white blood cell count, unspecified: Secondary | ICD-10-CM | POA: Diagnosis not present

## 2021-06-07 DIAGNOSIS — R600 Localized edema: Secondary | ICD-10-CM | POA: Diagnosis not present

## 2021-06-07 DIAGNOSIS — R5383 Other fatigue: Secondary | ICD-10-CM | POA: Diagnosis not present

## 2021-06-07 DIAGNOSIS — R233 Spontaneous ecchymoses: Secondary | ICD-10-CM | POA: Diagnosis not present

## 2021-06-12 ENCOUNTER — Other Ambulatory Visit (HOSPITAL_BASED_OUTPATIENT_CLINIC_OR_DEPARTMENT_OTHER): Payer: Self-pay

## 2021-06-12 ENCOUNTER — Inpatient Hospital Stay: Payer: BC Managed Care – PPO | Attending: Oncology

## 2021-06-12 ENCOUNTER — Inpatient Hospital Stay (HOSPITAL_BASED_OUTPATIENT_CLINIC_OR_DEPARTMENT_OTHER): Payer: BC Managed Care – PPO | Admitting: Nurse Practitioner

## 2021-06-12 ENCOUNTER — Encounter: Payer: Self-pay | Admitting: Nurse Practitioner

## 2021-06-12 VITALS — BP 143/80 | HR 89 | Temp 98.1°F | Resp 18 | Ht 65.0 in | Wt 195.8 lb

## 2021-06-12 DIAGNOSIS — D72829 Elevated white blood cell count, unspecified: Secondary | ICD-10-CM

## 2021-06-12 DIAGNOSIS — D75839 Thrombocytosis, unspecified: Secondary | ICD-10-CM

## 2021-06-12 DIAGNOSIS — F17210 Nicotine dependence, cigarettes, uncomplicated: Secondary | ICD-10-CM | POA: Diagnosis not present

## 2021-06-12 DIAGNOSIS — M4800 Spinal stenosis, site unspecified: Secondary | ICD-10-CM | POA: Diagnosis not present

## 2021-06-12 DIAGNOSIS — I10 Essential (primary) hypertension: Secondary | ICD-10-CM | POA: Diagnosis not present

## 2021-06-12 LAB — CBC WITH DIFFERENTIAL (CANCER CENTER ONLY)
Abs Immature Granulocytes: 0.09 10*3/uL — ABNORMAL HIGH (ref 0.00–0.07)
Basophils Absolute: 0.1 10*3/uL (ref 0.0–0.1)
Basophils Relative: 1 %
Eosinophils Absolute: 0.2 10*3/uL (ref 0.0–0.5)
Eosinophils Relative: 2 %
HCT: 45.4 % (ref 36.0–46.0)
Hemoglobin: 14.9 g/dL (ref 12.0–15.0)
Immature Granulocytes: 1 %
Lymphocytes Relative: 20 %
Lymphs Abs: 2.8 10*3/uL (ref 0.7–4.0)
MCH: 29.9 pg (ref 26.0–34.0)
MCHC: 32.8 g/dL (ref 30.0–36.0)
MCV: 91 fL (ref 80.0–100.0)
Monocytes Absolute: 1.1 10*3/uL — ABNORMAL HIGH (ref 0.1–1.0)
Monocytes Relative: 8 %
Neutro Abs: 9.8 10*3/uL — ABNORMAL HIGH (ref 1.7–7.7)
Neutrophils Relative %: 68 %
Platelet Count: 448 10*3/uL — ABNORMAL HIGH (ref 150–400)
RBC: 4.99 MIL/uL (ref 3.87–5.11)
RDW: 13.3 % (ref 11.5–15.5)
WBC Count: 14.1 10*3/uL — ABNORMAL HIGH (ref 4.0–10.5)
nRBC: 0 % (ref 0.0–0.2)

## 2021-06-12 LAB — LACTATE DEHYDROGENASE: LDH: 182 U/L (ref 98–192)

## 2021-06-12 LAB — VITAMIN B12: Vitamin B-12: 292 pg/mL (ref 180–914)

## 2021-06-12 LAB — SAVE SMEAR(SSMR), FOR PROVIDER SLIDE REVIEW

## 2021-06-12 NOTE — Progress Notes (Signed)
New Hematology/Oncology Consult   Requesting MD: Dr. Georgianne Fick  [phone]      Reason for Consult: Elevated white blood cell count  HPI: Beth Miles is a 51 year old woman referred for evaluation of an elevated white count.  She was seen by Dr. Jolayne Panther 05/10/2021 for follow-up of an elevated white count and elevated blood pressure.  CBC from 05/11/2021 returned with hemoglobin 14.1, white count 15.6 and platelet count 449,000.  Comparison CBC from 10/17/2020 showed hemoglobin 14.0, white count 15.9, ANC elevated at 11, absolute lymphocytes normal at 3.2, absolute monocytes elevated at 1.4, platelet count 383,000; 07/24/2020 hemoglobin 13.4, white count 14.6, platelets 323,000; 07/06/2020 hemoglobin 14.6, white count 17.5, platelet count 425,000.  Review of labs in the EMR from 02/25/2020 showed hemoglobin 15.4, white count 16.3, platelet count 426,000.  Review of records in care everywhere show hematology evaluation of petechiae and leukocytosis February 2018 by Dr. Jill Alexanders with [facility]-peripheral blood flow cytometry negative, normal factor VIII activity, normal von Willebrand panel.    Past medical history: "Early COPD " Spinal stenosis   Current Outpatient Medications:    naproxen sodium (ALEVE) 220 MG tablet, Take 220 mg by mouth., Disp: , Rfl:    traMADol (ULTRAM) 50 MG tablet, 1 tablet as needed, Disp: , Rfl: :    Allergies  Allergen Reactions   Oxycodone-Acetaminophen Itching    Other reaction(s): Chest Pain   Hydrocodone Itching and Rash   Hydrocodone-Acetaminophen Itching    FH: Father had bladder cancer, COPD; mother skin cancer; maternal grandmother breast cancer; maternal grandfather stomach cancer; paternal grandmother bladder cancer, cousin leukemia  SOCIAL HISTORY: She lives in [place].  She is married.  She has 3 children in good health.  She does quality check type work.  Tobacco use 1 pack/day for 30+ years.  No EtOH intake.  Review of  Systems: Periodic sweating episodes, feels different than hot flashes.  No associated fever.  No anorexia or weight loss.  She notes easy bruising for many years over the forearms and lower legs.  She occasionally notes bright red blood on the toilet tissue after bowel movement.  She has frequent headaches.  Vision is blurry at times.  No dysphagia.  Cough which she attributes to bronchitis.  She has dyspnea on exertion.  For the past year she has had intermittent diarrhea.  She feels that she urinates frequently and has been diagnosed with IC in the past.  Physical Exam:  Blood pressure (!) 143/80, pulse 89, temperature 98.1 F (36.7 C), temperature source Oral, resp. rate 18, height 5\' 5"  (1.651 m), weight 195 lb 12.8 oz (88.8 kg), SpO2 100 %.  HEENT: Posterior palate is erythematous.  No thrush or ulcers. Lungs: Distant breath sounds.  No respiratory distress. Cardiac: Regular rate and rhythm. Abdomen: Abdomen soft and nontender.  No hepatosplenomegaly. Vascular: No leg edema. Lymph nodes: No palpable cervical, supraclavicular, axillary or inguinal lymph nodes. Neurologic: Alert and oriented. Skin: Ecchymoses scattered over the forearms and lower legs.  LABS:   Recent Labs    06/12/21 1049  WBC 14.1*  HGB 14.9  HCT 45.4  PLT 448*   Peripheral blood smear-Red blood cells are unremarkable, no nucleated red blood cells; majority of white blood cells are mature appearing neutrophils, no myelocytes or other young forms, no monotonous white cell population, several 5 lobed neutrophils; platelets appear increased in number  RADIOLOGY:  No results found.  Assessment and Plan:   Leukocytosis and thrombocytosis Easy bruising "Early COPD" Spinal stenosis  Tobacco use  Ms. Arcos was referred for evaluation of leukocytosis.  She also has thrombocytosis.  We discussed various potential etiologies.  She understands the white cell elevation could be due to smoking and will try to  discontinue smoking prior to the next CBC in 1 month.  We will obtain additional laboratory evaluation to include JAK2 mutation analysis, BCR/ABL FISH.  She will return for follow-up in 1 month.  Patient seen with Dr. Truett Perna.    Lonna Cobb, NP 06/12/2021, 12:30 PM   This was a shared visit with Lonna Cobb.  Ms. Crookshanks was interviewed and examined.  I reviewed the peripheral blood smear.  She was referred for evaluation of leukocytosis.  She has mild thrombocytosis.  The hematologic findings appear chronic.  I have a low clinical suspicion for CML, though she could have another myeloproliferative disorder.  Smoking may explain the leukocytosis.  We obtained additional laboratory evaluation today.  We encouraged her to discontinue smoking.  She has easy bruising.  She had a normal von Willebrand panel in 2018 and platelet function study suggested an aspirin effect.  The bruising could be related to a normal variant, use of naproxen, or a myeloproliferative disorder.  I was present for greater than 50% of today's visit.  I performed medical decision making.  Mancel Bale, MD

## 2021-06-19 LAB — BCR ABL1 FISH (GENPATH)

## 2021-06-19 LAB — JAK2 (INCLUDING V617F AND EXON 12), MPL,& CALR W/RFL MPN PANEL (NGS)

## 2021-06-20 ENCOUNTER — Telehealth: Payer: Self-pay

## 2021-06-20 NOTE — Telephone Encounter (Signed)
-----   Message from Rana Snare, NP sent at 06/19/2021  4:22 PM EDT ----- Please let her know the testing for a myeloproliferative disorder returned negative.  The elevated white count may be due to smoking.  Follow-up as scheduled.

## 2021-07-04 DIAGNOSIS — M5417 Radiculopathy, lumbosacral region: Secondary | ICD-10-CM | POA: Diagnosis not present

## 2021-07-04 DIAGNOSIS — M797 Fibromyalgia: Secondary | ICD-10-CM | POA: Diagnosis not present

## 2021-07-04 DIAGNOSIS — G43019 Migraine without aura, intractable, without status migrainosus: Secondary | ICD-10-CM | POA: Diagnosis not present

## 2021-07-13 ENCOUNTER — Inpatient Hospital Stay: Payer: BC Managed Care – PPO | Admitting: Oncology

## 2021-07-13 ENCOUNTER — Inpatient Hospital Stay: Payer: BC Managed Care – PPO

## 2021-08-07 ENCOUNTER — Inpatient Hospital Stay (HOSPITAL_BASED_OUTPATIENT_CLINIC_OR_DEPARTMENT_OTHER): Payer: BC Managed Care – PPO | Admitting: Oncology

## 2021-08-07 ENCOUNTER — Inpatient Hospital Stay: Payer: BC Managed Care – PPO | Attending: Oncology

## 2021-08-07 VITALS — BP 141/84 | HR 89 | Temp 97.9°F | Resp 18 | Ht 65.0 in | Wt 192.4 lb

## 2021-08-07 DIAGNOSIS — F17210 Nicotine dependence, cigarettes, uncomplicated: Secondary | ICD-10-CM | POA: Insufficient documentation

## 2021-08-07 DIAGNOSIS — D72829 Elevated white blood cell count, unspecified: Secondary | ICD-10-CM

## 2021-08-07 DIAGNOSIS — D75839 Thrombocytosis, unspecified: Secondary | ICD-10-CM | POA: Insufficient documentation

## 2021-08-07 LAB — CBC WITH DIFFERENTIAL (CANCER CENTER ONLY)
Abs Immature Granulocytes: 0.05 10*3/uL (ref 0.00–0.07)
Basophils Absolute: 0 10*3/uL (ref 0.0–0.1)
Basophils Relative: 0 %
Eosinophils Absolute: 0.1 10*3/uL (ref 0.0–0.5)
Eosinophils Relative: 1 %
HCT: 45.2 % (ref 36.0–46.0)
Hemoglobin: 15.1 g/dL — ABNORMAL HIGH (ref 12.0–15.0)
Immature Granulocytes: 0 %
Lymphocytes Relative: 19 %
Lymphs Abs: 2.3 10*3/uL (ref 0.7–4.0)
MCH: 30.7 pg (ref 26.0–34.0)
MCHC: 33.4 g/dL (ref 30.0–36.0)
MCV: 91.9 fL (ref 80.0–100.0)
Monocytes Absolute: 1.2 10*3/uL — ABNORMAL HIGH (ref 0.1–1.0)
Monocytes Relative: 10 %
Neutro Abs: 8.1 10*3/uL — ABNORMAL HIGH (ref 1.7–7.7)
Neutrophils Relative %: 70 %
Platelet Count: 288 10*3/uL (ref 150–400)
RBC: 4.92 MIL/uL (ref 3.87–5.11)
RDW: 13.2 % (ref 11.5–15.5)
WBC Count: 11.7 10*3/uL — ABNORMAL HIGH (ref 4.0–10.5)
nRBC: 0 % (ref 0.0–0.2)

## 2021-08-07 NOTE — Progress Notes (Signed)
  [facility] OFFICE PROGRESS NOTE   Diagnosis: Leukocytosis, thrombocytosis  INTERVAL HISTORY:   Beth Miles returns as scheduled.  She continues smoking, but she has decreased cigarette use.  She reports receiving steroid injections for stenosis every 3 months for the past 10 years.  She reports bruises over the extremities.  She has developed intermittent "knots "at the upper abdomen.  These resolved spontaneously.  She is seeing a dentist for gum bleeding.  No other bleeding.  Objective:  Vital signs in last 24 hours:  Blood pressure (!) 141/84, pulse 89, temperature 97.9 F (36.6 C), temperature source Oral, resp. rate 18, height 5\' 5"  (1.651 m), weight 192 lb 6.4 oz (87.3 kg), SpO2 100 %.    HEENT: She appears to have periodontal disease.  No current bleeding. Resp: Lungs clear bilaterally Cardio: Regular rate and rhythm GI: No hepatosplenomegaly Vascular: No leg edema  Skin: Changes of "steroid purpura "over the forearm bilaterally.  Small resolving ecchymoses at the lower legs.  Skin without erythema or palpable cord   Lab Results:  Lab Results  Component Value Date   WBC 11.7 (H) 08/07/2021   HGB 15.1 (H) 08/07/2021   HCT 45.2 08/07/2021   MCV 91.9 08/07/2021   PLT 288 08/07/2021   NEUTROABS 8.1 (H) 08/07/2021     Medications: I have reviewed the patient's current medications.   Assessment/Plan: Leukocytosis and thrombocytosis Negative myeloproliferative panel and BCR -ABL FISH 06/12/2021 Easy bruising "Early COPD" Spinal stenosis Tobacco use    Disposition: Beth Miles was referred for evaluation of leukocytosis.  I have a low clinical suspicion for a myeloproliferative disorder.  Peripheral blood testing for myeloproliferative mutations and the CML rearrangement were negative.  I suspect the leukocytosis is related to smoking and steroid injection therapy. The platelet count was mildly elevated when was here in May and is normal today.  The  hemoglobin is at the high end of the normal range, likely secondary to tobacco use.  I suspect the bruising is related to chronic steroid therapy.  She will contact June if she develops a palpable "knot "and we will have her come in for an exam.  I encouraged her to discontinue smoking.  She will return for an office visit and CBC in 4 months.  [place], MD  08/07/2021  10:31 AM

## 2021-10-27 DIAGNOSIS — N390 Urinary tract infection, site not specified: Secondary | ICD-10-CM | POA: Diagnosis not present

## 2021-10-27 DIAGNOSIS — H9202 Otalgia, left ear: Secondary | ICD-10-CM | POA: Diagnosis not present

## 2021-11-08 DIAGNOSIS — Z0000 Encounter for general adult medical examination without abnormal findings: Secondary | ICD-10-CM | POA: Diagnosis not present

## 2021-11-15 ENCOUNTER — Encounter (HOSPITAL_COMMUNITY): Payer: Self-pay | Admitting: Internal Medicine

## 2021-11-15 ENCOUNTER — Other Ambulatory Visit (HOSPITAL_COMMUNITY): Payer: Self-pay | Admitting: Internal Medicine

## 2021-11-15 ENCOUNTER — Encounter: Payer: Self-pay | Admitting: Internal Medicine

## 2021-11-15 ENCOUNTER — Ambulatory Visit (HOSPITAL_BASED_OUTPATIENT_CLINIC_OR_DEPARTMENT_OTHER)
Admission: RE | Admit: 2021-11-15 | Discharge: 2021-11-15 | Disposition: A | Discharge status: Home / Self Care | Payer: BC Managed Care – PPO | Source: Ambulatory Visit | Attending: Internal Medicine | Admitting: Internal Medicine

## 2021-11-15 DIAGNOSIS — R748 Abnormal levels of other serum enzymes: Secondary | ICD-10-CM | POA: Diagnosis not present

## 2021-11-15 DIAGNOSIS — R110 Nausea: Secondary | ICD-10-CM | POA: Diagnosis not present

## 2021-11-15 DIAGNOSIS — R1010 Upper abdominal pain, unspecified: Secondary | ICD-10-CM | POA: Diagnosis not present

## 2021-11-15 DIAGNOSIS — R634 Abnormal weight loss: Secondary | ICD-10-CM | POA: Diagnosis not present

## 2021-11-15 DIAGNOSIS — D72829 Elevated white blood cell count, unspecified: Secondary | ICD-10-CM | POA: Diagnosis not present

## 2021-11-15 DIAGNOSIS — Z0000 Encounter for general adult medical examination without abnormal findings: Secondary | ICD-10-CM | POA: Diagnosis not present

## 2021-11-15 MED ORDER — IOHEXOL 300 MG/ML  SOLN
100.0000 mL | Freq: Once | INTRAMUSCULAR | Status: AC | PRN
  Administered 2021-11-15: 100 mL via INTRAVENOUS

## 2021-11-29 DIAGNOSIS — R634 Abnormal weight loss: Secondary | ICD-10-CM | POA: Diagnosis not present

## 2021-11-29 DIAGNOSIS — R1010 Upper abdominal pain, unspecified: Secondary | ICD-10-CM | POA: Diagnosis not present

## 2021-11-29 DIAGNOSIS — R109 Unspecified abdominal pain: Secondary | ICD-10-CM | POA: Diagnosis not present

## 2021-11-29 DIAGNOSIS — R748 Abnormal levels of other serum enzymes: Secondary | ICD-10-CM | POA: Diagnosis not present

## 2021-11-29 DIAGNOSIS — R300 Dysuria: Secondary | ICD-10-CM | POA: Diagnosis not present

## 2021-12-13 ENCOUNTER — Inpatient Hospital Stay: Payer: BC Managed Care – PPO | Admitting: Nurse Practitioner

## 2021-12-13 ENCOUNTER — Inpatient Hospital Stay: Payer: BC Managed Care – PPO

## 2022-03-06 DIAGNOSIS — H25011 Cortical age-related cataract, right eye: Secondary | ICD-10-CM | POA: Diagnosis not present

## 2022-03-06 DIAGNOSIS — H25042 Posterior subcapsular polar age-related cataract, left eye: Secondary | ICD-10-CM | POA: Diagnosis not present

## 2022-03-06 DIAGNOSIS — H16223 Keratoconjunctivitis sicca, not specified as Sjogren's, bilateral: Secondary | ICD-10-CM | POA: Diagnosis not present

## 2022-03-06 DIAGNOSIS — H2512 Age-related nuclear cataract, left eye: Secondary | ICD-10-CM | POA: Diagnosis not present

## 2022-05-22 DIAGNOSIS — H52223 Regular astigmatism, bilateral: Secondary | ICD-10-CM | POA: Diagnosis not present

## 2022-05-22 DIAGNOSIS — H40013 Open angle with borderline findings, low risk, bilateral: Secondary | ICD-10-CM | POA: Diagnosis not present

## 2022-05-22 DIAGNOSIS — H25813 Combined forms of age-related cataract, bilateral: Secondary | ICD-10-CM | POA: Diagnosis not present

## 2022-06-13 DIAGNOSIS — H25813 Combined forms of age-related cataract, bilateral: Secondary | ICD-10-CM | POA: Diagnosis not present

## 2022-06-20 DIAGNOSIS — H40013 Open angle with borderline findings, low risk, bilateral: Secondary | ICD-10-CM | POA: Diagnosis not present

## 2022-06-20 DIAGNOSIS — F17200 Nicotine dependence, unspecified, uncomplicated: Secondary | ICD-10-CM | POA: Diagnosis not present

## 2022-06-20 DIAGNOSIS — H25813 Combined forms of age-related cataract, bilateral: Secondary | ICD-10-CM | POA: Diagnosis not present

## 2022-06-20 DIAGNOSIS — E785 Hyperlipidemia, unspecified: Secondary | ICD-10-CM | POA: Diagnosis not present

## 2022-06-20 DIAGNOSIS — H52223 Regular astigmatism, bilateral: Secondary | ICD-10-CM | POA: Diagnosis not present

## 2022-06-20 DIAGNOSIS — E669 Obesity, unspecified: Secondary | ICD-10-CM | POA: Diagnosis not present

## 2022-06-20 DIAGNOSIS — Z885 Allergy status to narcotic agent status: Secondary | ICD-10-CM | POA: Diagnosis not present

## 2022-06-20 DIAGNOSIS — H25812 Combined forms of age-related cataract, left eye: Secondary | ICD-10-CM | POA: Diagnosis not present

## 2022-06-27 DIAGNOSIS — Z6831 Body mass index (BMI) 31.0-31.9, adult: Secondary | ICD-10-CM | POA: Diagnosis not present

## 2022-06-27 DIAGNOSIS — I10 Essential (primary) hypertension: Secondary | ICD-10-CM | POA: Diagnosis not present

## 2022-06-27 DIAGNOSIS — G629 Polyneuropathy, unspecified: Secondary | ICD-10-CM | POA: Diagnosis not present

## 2022-06-27 DIAGNOSIS — E669 Obesity, unspecified: Secondary | ICD-10-CM | POA: Diagnosis not present

## 2022-06-27 DIAGNOSIS — H40013 Open angle with borderline findings, low risk, bilateral: Secondary | ICD-10-CM | POA: Diagnosis not present

## 2022-06-27 DIAGNOSIS — E785 Hyperlipidemia, unspecified: Secondary | ICD-10-CM | POA: Diagnosis not present

## 2022-06-27 DIAGNOSIS — H25811 Combined forms of age-related cataract, right eye: Secondary | ICD-10-CM | POA: Diagnosis not present

## 2022-06-27 DIAGNOSIS — E781 Pure hyperglyceridemia: Secondary | ICD-10-CM | POA: Diagnosis not present

## 2022-06-27 DIAGNOSIS — F17210 Nicotine dependence, cigarettes, uncomplicated: Secondary | ICD-10-CM | POA: Diagnosis not present

## 2022-06-27 DIAGNOSIS — H25813 Combined forms of age-related cataract, bilateral: Secondary | ICD-10-CM | POA: Diagnosis not present

## 2022-08-14 DIAGNOSIS — M5417 Radiculopathy, lumbosacral region: Secondary | ICD-10-CM | POA: Diagnosis not present

## 2022-09-17 DIAGNOSIS — M5136 Other intervertebral disc degeneration, lumbar region: Secondary | ICD-10-CM | POA: Diagnosis not present

## 2022-11-12 DIAGNOSIS — M7918 Myalgia, other site: Secondary | ICD-10-CM | POA: Diagnosis not present

## 2022-11-12 DIAGNOSIS — G43709 Chronic migraine without aura, not intractable, without status migrainosus: Secondary | ICD-10-CM | POA: Diagnosis not present

## 2022-12-11 DIAGNOSIS — Z0000 Encounter for general adult medical examination without abnormal findings: Secondary | ICD-10-CM | POA: Diagnosis not present

## 2022-12-11 DIAGNOSIS — R5383 Other fatigue: Secondary | ICD-10-CM | POA: Diagnosis not present

## 2022-12-19 DIAGNOSIS — Z0000 Encounter for general adult medical examination without abnormal findings: Secondary | ICD-10-CM | POA: Diagnosis not present

## 2022-12-19 DIAGNOSIS — E65 Localized adiposity: Secondary | ICD-10-CM | POA: Diagnosis not present

## 2022-12-19 DIAGNOSIS — R748 Abnormal levels of other serum enzymes: Secondary | ICD-10-CM | POA: Diagnosis not present

## 2022-12-19 DIAGNOSIS — M940 Chondrocostal junction syndrome [Tietze]: Secondary | ICD-10-CM | POA: Diagnosis not present

## 2022-12-19 DIAGNOSIS — R0789 Other chest pain: Secondary | ICD-10-CM | POA: Diagnosis not present

## 2023-01-14 DIAGNOSIS — G43709 Chronic migraine without aura, not intractable, without status migrainosus: Secondary | ICD-10-CM | POA: Diagnosis not present

## 2023-01-14 DIAGNOSIS — M7918 Myalgia, other site: Secondary | ICD-10-CM | POA: Diagnosis not present

## 2023-07-01 DIAGNOSIS — L720 Epidermal cyst: Secondary | ICD-10-CM | POA: Diagnosis not present

## 2023-08-13 DIAGNOSIS — G43709 Chronic migraine without aura, not intractable, without status migrainosus: Secondary | ICD-10-CM | POA: Diagnosis not present

## 2023-08-13 DIAGNOSIS — Z79899 Other long term (current) drug therapy: Secondary | ICD-10-CM | POA: Diagnosis not present

## 2023-08-13 DIAGNOSIS — G5603 Carpal tunnel syndrome, bilateral upper limbs: Secondary | ICD-10-CM | POA: Diagnosis not present
